# Patient Record
Sex: Male | Born: 1950 | Race: White | Hispanic: No | Marital: Single
Health system: Southern US, Community
[De-identification: ages and names within clinical notes are randomized; demographics above are authoritative.]

---

## 2020-01-26 ENCOUNTER — Emergency Department: Payer: Medicare Other

## 2020-01-26 ENCOUNTER — Emergency Department
Admission: EM | Admit: 2020-01-26 | Discharge: 2020-01-26 | Disposition: A | Discharge status: Home / Self Care | Payer: Medicare Other | Attending: Emergency Medicine | Admitting: Emergency Medicine

## 2020-01-26 ENCOUNTER — Other Ambulatory Visit: Payer: Self-pay

## 2020-01-26 DIAGNOSIS — D869 Sarcoidosis, unspecified: Secondary | ICD-10-CM | POA: Insufficient documentation

## 2020-01-26 DIAGNOSIS — Z20822 Contact with and (suspected) exposure to covid-19: Secondary | ICD-10-CM | POA: Insufficient documentation

## 2020-01-26 DIAGNOSIS — R079 Chest pain, unspecified: Secondary | ICD-10-CM | POA: Diagnosis present

## 2020-01-26 LAB — BASIC METABOLIC PANEL
Anion gap: 5 (ref 5–15)
BUN: 17 mg/dL (ref 8–23)
CO2: 25 mmol/L (ref 22–32)
Calcium: 9 mg/dL (ref 8.9–10.3)
Chloride: 110 mmol/L (ref 98–111)
Creatinine, Ser: 0.99 mg/dL (ref 0.61–1.24)
GFR calc Af Amer: >60 mL/min (ref >60)
GFR calc non Af Amer: >60 mL/min (ref >60)
Glucose, Bld: 88 mg/dL (ref 70–99)
Potassium: 3.9 mmol/L (ref 3.5–5.1)
Sodium: 140 mmol/L (ref 135–145)

## 2020-01-26 LAB — CBC
HCT: 42.3 % (ref 39.0–52.0)
Hemoglobin: 15.1 g/dL (ref 13.0–17.0)
MCH: 31.9 pg (ref 26.0–34.0)
MCHC: 35.7 g/dL (ref 30.0–36.0)
MCV: 89.2 fL (ref 80.0–100.0)
Platelets: 153 10*3/uL (ref 150–400)
RBC: 4.74 MIL/uL (ref 4.22–5.81)
RDW: 12.4 % (ref 11.5–15.5)
WBC: 9.4 10*3/uL (ref 4.0–10.5)
nRBC: 0 % (ref 0.0–0.2)

## 2020-01-26 LAB — SARS CORONAVIRUS 2 BY RT PCR (HOSPITAL ORDER, PERFORMED IN ~~LOC~~ HOSPITAL LAB): SARS Coronavirus 2: NEGATIVE

## 2020-01-26 LAB — TROPONIN I (HIGH SENSITIVITY): Troponin I (High Sensitivity): 13 ng/L (ref <18)

## 2020-01-26 NOTE — Discharge Instructions (Addendum)
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  It is very important that you follow-up with a pulmonary (lung) specialist to arrange for further tests and possible treatment as an outpatient.  We have provided referral information.  In the meantime, return to the ER for new, worsening, or persistent severe chest pain, difficulty breathing, weakness or lightheadedness, high fevers, or any other new or worsening symptoms that concern you.

## 2020-01-26 NOTE — ED Triage Notes (Signed)
Pt comes POV with chest pain. Pt reports pain that burns from center of his chest down left arm. Pt reports that this has been going on for a couple of months and he has these episodes where the pain makes him weak and confused then goes away. Having chest pain right now. Reports it feels like when he had covid pneumonia.

## 2020-01-26 NOTE — ED Provider Notes (Signed)
[facility] Emergency Department Provider Note ____________________________________________   First MD Initiated Contact with Patient 01/26/20 1828     (approximate)  I have reviewed the triage vital signs and the nursing notes.   HISTORY  Chief Complaint Chest Pain    HPI Alexis Bruce is a 69 y.o. male with no active medical problems who presents with chest pain over the last few months, possibly slightly worsened in the last few weeks, described as burning, present across both sides of his chest, and worst in the morning.  The patient states he is at a drug rehabilitation facility and has mentioned to people there, who told him he may be having anxiety.  He denies any specific anxiety.  He states he had Covid at the beginning of the year and has not been vaccinated.  He states the pain feels somewhat similar to when he had Covid pneumonia, although he denies associated cough, fever, shortness of breath, or systemic symptoms.  History reviewed. No pertinent past medical history.  There are no problems to display for this patient.     Prior to Admission medications   Not on File    Allergies Patient has no known allergies.  History reviewed. No pertinent family history.  Social History Social History   Tobacco Use  . Smoking status: Not on file  Substance Use Topics  . Alcohol use: Not on file  . Drug use: Not on file    Review of Systems  Constitutional: No fever/chills. Eyes: No redness. ENT: No sore throat. Cardiovascular: Positive for chest pain. Respiratory: Denies shortness of breath. Gastrointestinal: No vomiting or diarrhea.  Genitourinary: Negative for dysuria.  Musculoskeletal: Negative for back pain. Skin: Negative for rash. Neurological: Negative for headache.   ____________________________________________   PHYSICAL EXAM:  VITAL SIGNS: ED Triage Vitals [01/26/20 1530]  Enc Vitals Group     BP (!) 127/24      Pulse Rate (!) 51     Resp 18     Temp 97.6 F (36.4 C)     Temp Source Oral     SpO2 94 %     Weight 165 lb (74.8 kg)     Height 5\' 8"  (1.727 m)     Head Circumference      Peak Flow      Pain Score 4     Pain Loc      Pain Edu?      Excl. in GC?     Constitutional: Alert and oriented. Well appearing and in no acute distress. Eyes: Conjunctivae are normal.  Head: Atraumatic. Nose: No congestion/rhinnorhea. Mouth/Throat: Mucous membranes are moist.   Neck: Normal range of motion.  Cardiovascular: Normal rate, regular rhythm. Grossly normal heart sounds.  Good peripheral circulation. Respiratory: Normal respiratory effort.  No retractions. Lungs CTAB. Gastrointestinal: No distention.  Musculoskeletal: Extremities warm and well perfused.  Neurologic:  Normal speech and language. No gross focal neurologic deficits are appreciated.  Skin:  Skin is warm and dry. No rash noted. Psychiatric: Mood and affect are normal. Speech and behavior are normal.  ____________________________________________   LABS (all labs ordered are listed, but only abnormal results are displayed)  Labs Reviewed  SARS CORONAVIRUS 2 BY RT PCR (HOSPITAL ORDER, PERFORMED IN [facility])  BASIC METABOLIC PANEL  CBC  TROPONIN I (HIGH SENSITIVITY)   ____________________________________________  EKG  ED ECG REPORT I, , the attending physician, personally viewed and interpreted this ECG.  Date: 01/26/2020 EKG  Time: 1523 Rate: 55 Rhythm: normal sinus rhythm QRS Axis: normal Intervals: normal ST/T Wave abnormalities: normal Narrative Interpretation: no evidence of acute ischemia  ____________________________________________  RADIOLOGY  CXR: Multifocal opacities CT chest: Bilateral reticulonodular opacities and hilar adenopathy suggestive of sarcoidosis ____________________________________________   PROCEDURES  Procedure(s) performed:  No  Procedures  Critical Care performed: No ____________________________________________   INITIAL IMPRESSION / ASSESSMENT AND PLAN / ED COURSE  Pertinent labs & imaging results that were available during my care of the patient were reviewed by me and considered in my medical decision making (see chart for details).  69 year old male with no active medical problems presents with atypical and somewhat chronic chest pain which has been slightly worse recently.  He states he gets slightly winded when he exerts himself, but denies active shortness of breath, fever, or other associated symptoms.  He had Covid earlier this year but was not vaccinated subsequently.  On exam he is overall well-appearing.  His vital signs are normal.  The physical exam is unremarkable.  His lungs are clear to auscultation.  EKG is nonischemic.  Basic labs obtained from triage are also within normal limits, and his troponin is negative.  There is no increased indication for a repeat given the duration of his symptoms.  Chest x-ray, however, shows multifocal opacities.   Clinically the patient does not have symptoms of active pneumonia.  Differential includes scarring related to his COVID-19 pneumonia, recurrent COVID-19 especially given the delta variant present now, bronchitis, GERD or other nonpulmonary etiology.  There is no evidence of PE given his normal vital signs and the duration of the symptoms.  There is also no evidence of vascular etiology.  We will obtain a CT chest for further evaluation as well as Covid swab.  ----------------------------------------- 8:34 PM on 01/26/2020 -----------------------------------------  CT chest shows findings concerning for sarcoidosis.  At this time, there is no indication for inpatient admission.  The patient's vital signs are normal and his lab work-up is unremarkable.  The Covid swab is negative.  The patient is comfortable with no active symptoms at this time.  He is  stable for discharge home.  I counseled him on the results of the work-up and the recommended plan of care.  I will make a referral to pulmonology for further outpatient evaluation.  Return precautions given, the patient expresses understanding. ____________________________________________   FINAL CLINICAL IMPRESSION(S) / ED DIAGNOSES  Final diagnoses:  Sarcoidosis      NEW MEDICATIONS STARTED DURING THIS VISIT:  New Prescriptions   No medications on file     Note:  This document was prepared using Dragon voice recognition software and may include unintentional dictation errors.   Dionne Bucy, MD 01/26/20 2034

## 2020-02-23 ENCOUNTER — Inpatient Hospital Stay: Admission: RE | Admit: 2020-02-23 | Payer: Medicare Other | Source: Ambulatory Visit

## 2020-02-24 ENCOUNTER — Encounter
Admission: RE | Admit: 2020-02-24 | Discharge: 2020-02-24 | Disposition: A | Discharge status: Home / Self Care | Payer: Medicare Other | Source: Ambulatory Visit | Attending: Pulmonary Disease | Admitting: Pulmonary Disease

## 2020-02-24 NOTE — Pre-Procedure Instructions (Signed)
After several attempts to reach Alexis Bruce, the patient manager at [facility] where Alexis Bruce was residing, we made contact today.  I was informed by Alexis Bruce that this patient left yesterday without anyone knowing that he was leaving or where he was going.  He left clothes and money at facility and does not have a phone.  Alexis Bruce was unsure where the patient may have gone but thinks he may have gone to [place] to his sisters' home.  The feeling was that the patient was on the road to medical wellness but was not able to make it to the end of his inpatient rehab program.Alexis Bruce was due to graduate in 4 weeks.         I contacted Dr. Rhea Belton' office and spoke with Morrie Sheldon to give her this information.  I recommended that he not be cancelled yet until we see if he appears for his covid test and blood work. As of now, we have no way to contact the patient and have no idea of where he may be staying.

## 2020-02-25 ENCOUNTER — Encounter: Payer: Self-pay | Admitting: Urgent Care

## 2020-02-25 ENCOUNTER — Other Ambulatory Visit
Admission: RE | Admit: 2020-02-25 | Discharge: 2020-02-25 | Disposition: A | Discharge status: Home / Self Care | Payer: Medicare Other | Source: Ambulatory Visit | Attending: Pulmonary Disease | Admitting: Pulmonary Disease

## 2020-02-25 ENCOUNTER — Other Ambulatory Visit: Payer: Self-pay | Admitting: Pulmonary Disease

## 2020-02-25 ENCOUNTER — Ambulatory Visit: Payer: Medicare Other

## 2020-02-25 DIAGNOSIS — J439 Emphysema, unspecified: Secondary | ICD-10-CM

## 2020-02-25 DIAGNOSIS — R591 Generalized enlarged lymph nodes: Secondary | ICD-10-CM

## 2020-02-25 NOTE — Progress Notes (Addendum)
  [facility] Perioperative Services: Pre-Admission/Anesthesia Testing     Date: 02/25/20  Name: Alexis Bruce MRN:   [id number]  Re: Missed appointments and plans for upcoming procedure.   Patient unable to be contacted for pre-admission interview.  Patient was scheduled today for lab work and SARS-CoV-2 testing, however he did not present clinic for his appointment.  Of note, we learned that as of 1600 yesterday patient no longer resides at [facility].  Care manager, Haywood Pao, advised that patient fled from the residence yesterday and no one currently knows his whereabouts.  Patient does not have a phone.  Patient is his own RP, and does not have an emergency contact on file beyond the staff at the group home.  In light of the fact that we are unable to establish contact with patient and perform necessary presurgical testing, will proceed with canceling patient's procedure scheduled for 02/27/2020.  Call placed to [facility] practice Karna Christmas, MD) to make him aware.  PCCM practice asked to have patient contact PAT clinic if they by some chance are able to make contact with him.  SDS charge nurse contacted and asked to remove patient from OR schedule at this time.  Quentin Mulling, MSN, APRN, FNP-C, [facility]  Peri-operative Services Nurse Practitioner Phone: [phone] 02/25/20 2:07 PM

## 2020-02-27 ENCOUNTER — Encounter: Admission: RE | Payer: Self-pay | Source: Home / Self Care

## 2020-02-27 ENCOUNTER — Ambulatory Visit: Admission: RE | Admit: 2020-02-27 | Payer: Medicare Other | Source: Home / Self Care

## 2020-02-27 SURGERY — VIDEO BRONCHOSCOPY WITH ENDOBRONCHIAL NAVIGATION
Anesthesia: General

## 2021-10-09 IMAGING — CT CT CHEST W/O CM
2 of 3 series · 15 of 36 positions shown, 18 images · non-contrast
Comparison: Radiograph 01/26/2020

CLINICAL DATA: Chest pain for several months, radiographic
abnormality

EXAM:
CT CHEST WITHOUT CONTRAST
TECHNIQUE: Multidetector CT imaging of the chest was performed following the
standard protocol without IV contrast.

[Series 2: thorax · axial · 0.70mm/px · z∈[-548,-276]mm · 12 of 160 slices shown, 15 images]
[im 12/160  mediastinal]
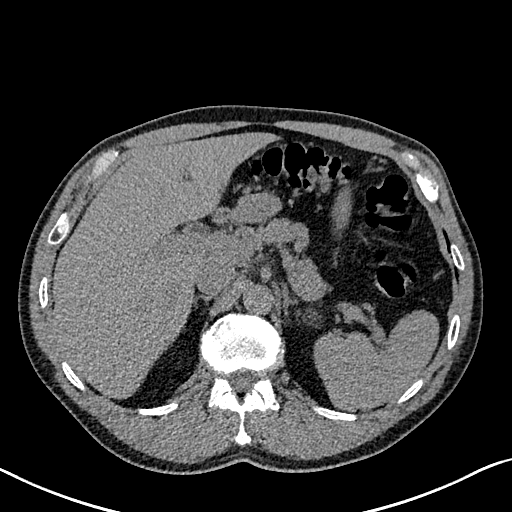
[im 12/160  lung]
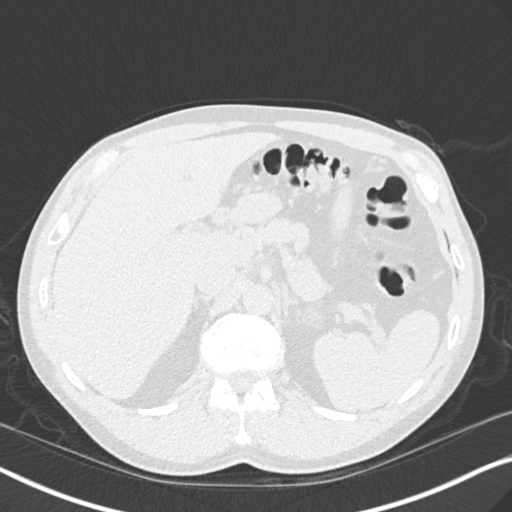
[im 24/160  lung]
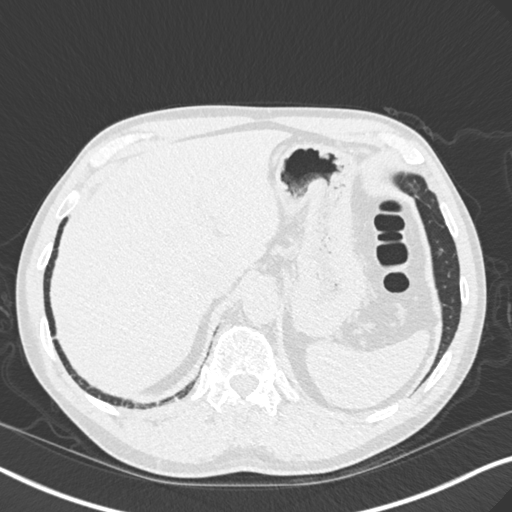
[im 36/160  lung]
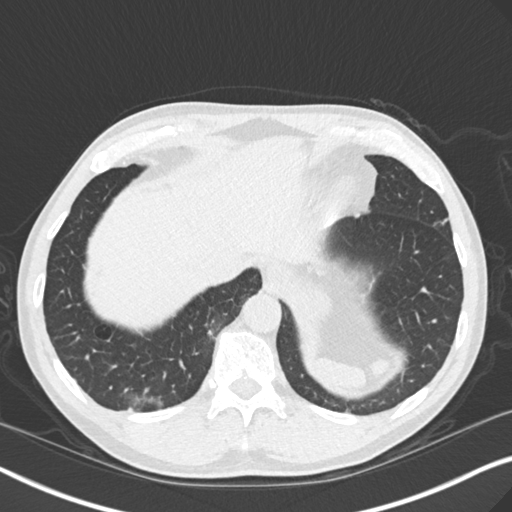
[im 48/160  lung]
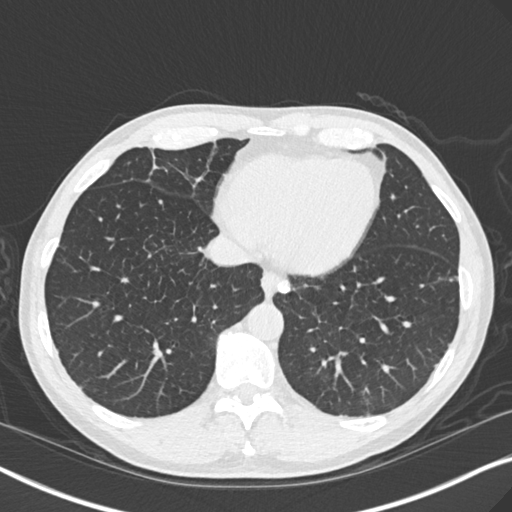
[im 59/160  mediastinal]
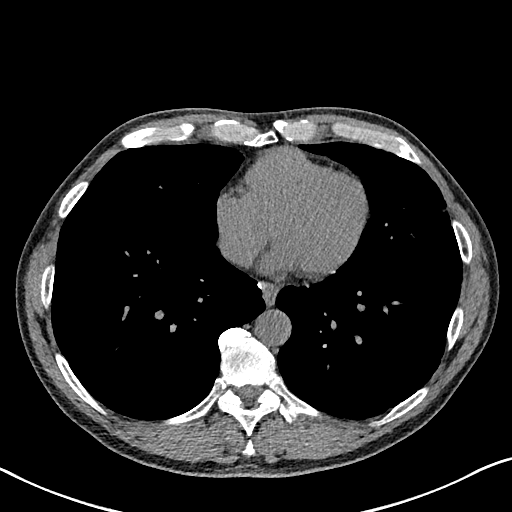
[im 59/160  lung]
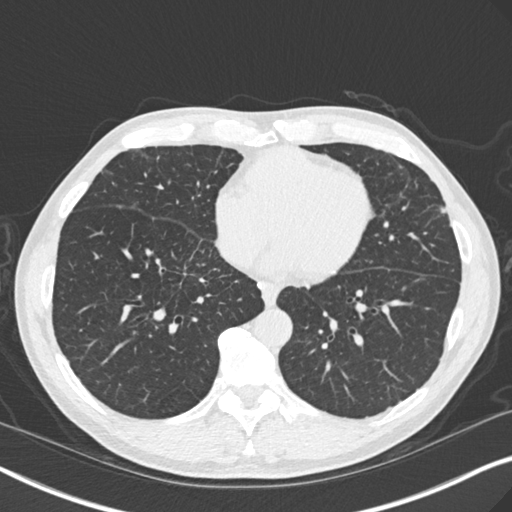
[im 71/160  lung]
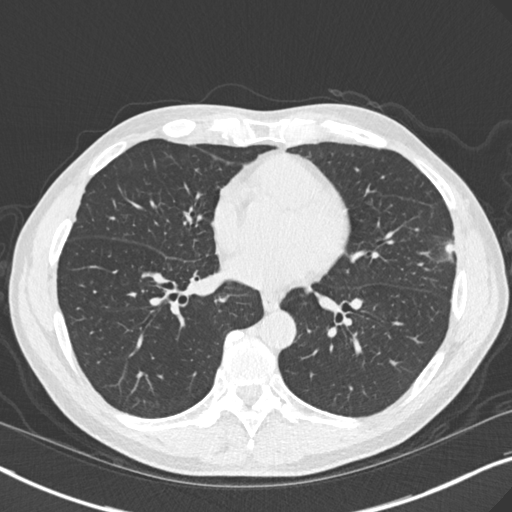
[im 89/160  lung]
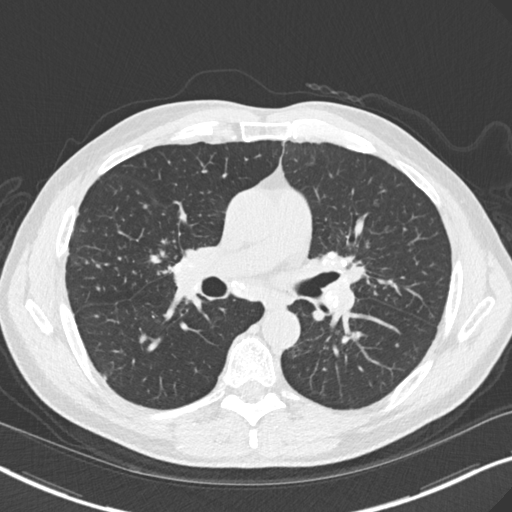
[im 101/160  lung]
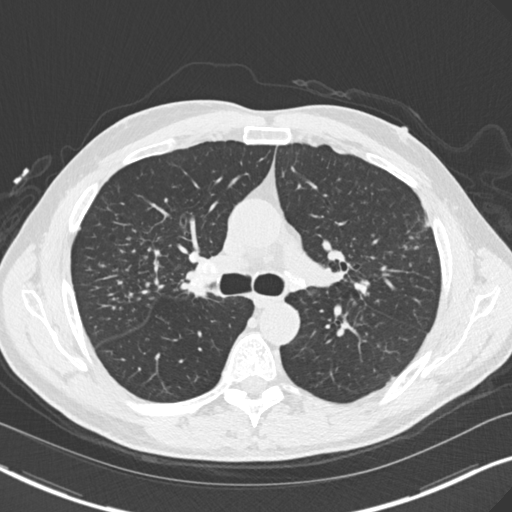
[im 112/160  mediastinal]
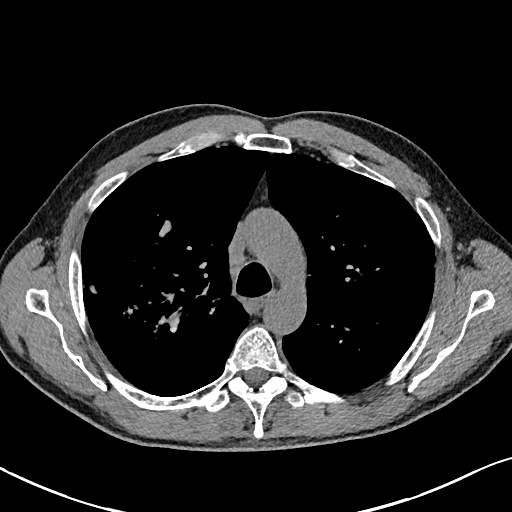
[im 112/160  lung]
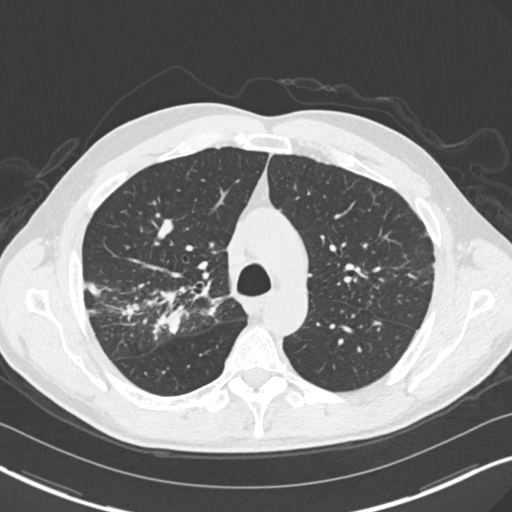
[im 124/160  lung]
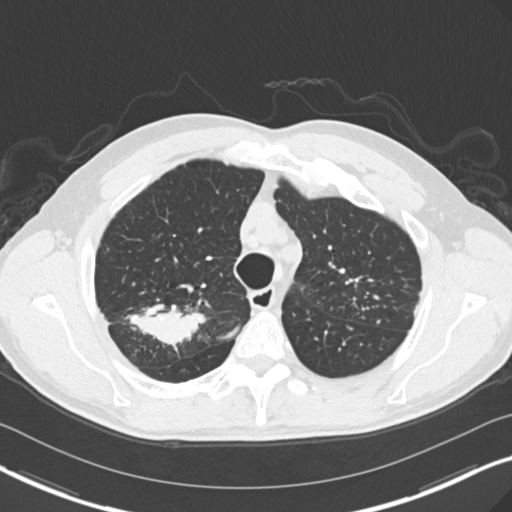
[im 136/160  lung]
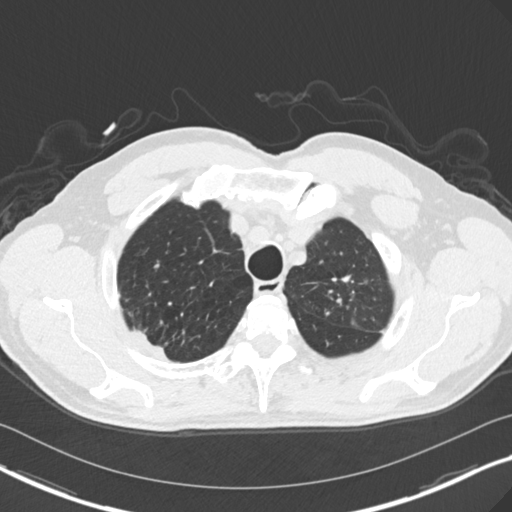
[im 148/160  lung]
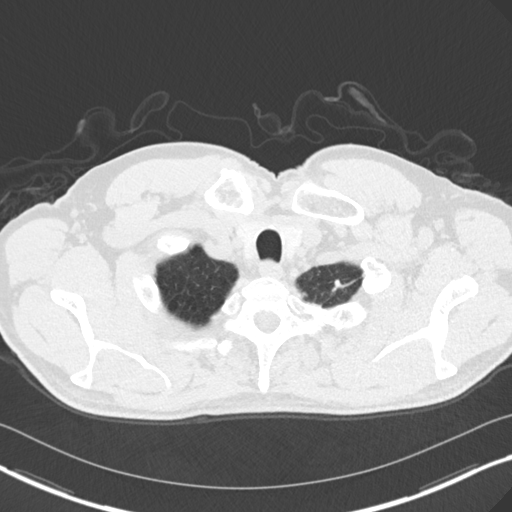

[Series 5: coronal · coronal · 0.64mm/px · 3 of 131 slices shown]
[im 27/131  lung]
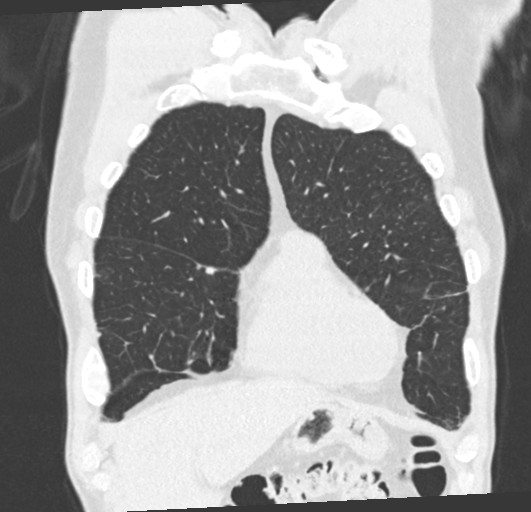
[im 53/131  lung]
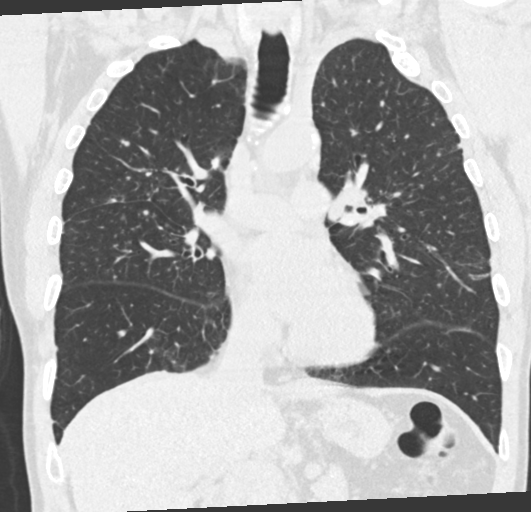
[im 79/131  lung]
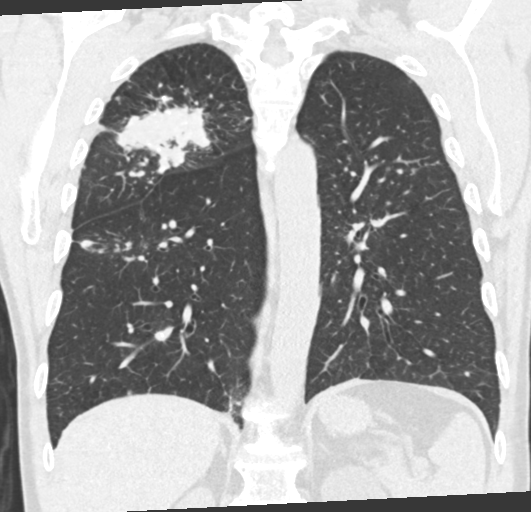

[15 of 36 positions shown; findings below may reference images not displayed]

FINDINGS: Cardiovascular: Normal heart size. No pericardial effusion.
Atherosclerotic plaque within the normal caliber aorta. Shared
origin of the brachiocephalic and left common carotid artery.
Central pulmonary arteries are normal caliber. Luminal evaluation of
the vasculature limited in the absence of contrast media.

Mediastinum/Nodes: Extensive mediastinal hilar adenopathy with
stippled calcifications. Larger nodes include a 13 mm subcarinal
node (2/69) a 12 mm calcified left hilar node (2/69) and a 14 mm
right hilar node (2/80). No axillary adenopathy. No mediastinal
fluid or gas. Normal thyroid gland and thoracic inlet. No acute
abnormality of the trachea or esophagus.

Lungs/Pleura: Widespread areas of reticulonodular opacity with a
upper lung predominance with several larger more confluent areas of
opacity in the right upper lobe demonstrating stable calcification
as well. There several additional nodules with surrounding reticular
changes including a 12 mm right upper lobe nodule (3/48). Associated
areas of architectural distortion and bronchiectasis. No
pneumothorax or effusion.

Upper Abdomen: No acute abnormalities present in the visualized
portions of the upper abdomen.

Musculoskeletal: No chest wall mass or suspicious bone lesions
identified. Degenerative changes in the spine and shoulders.
IMPRESSION: 1. Widespread areas of reticulonodular opacity and architectural
distortion with a upper lung predominance as well as more confluent
areas of calcified opacity with surrounding reticular change. There
is extensive calcified mediastinal and hilar adenopathy as well.
Constellation of findings is fairly suggestive of pulmonary sarcoid
though pulmonary consultation for more definitive diagnosis and
management is recommended.
2. Aortic Atherosclerosis ([id number]).

## 2021-10-09 IMAGING — CR DG CHEST 2V
1 series · 2 of 2 positions shown · non-contrast
Comparison: None.

CLINICAL DATA: 69-year-old male with chest pain.

EXAM:
CHEST - 2 VIEW

[Series 1: dg chest 2 view · 0.14mm/px · 2 of 2 slices shown]
[im 1/2]
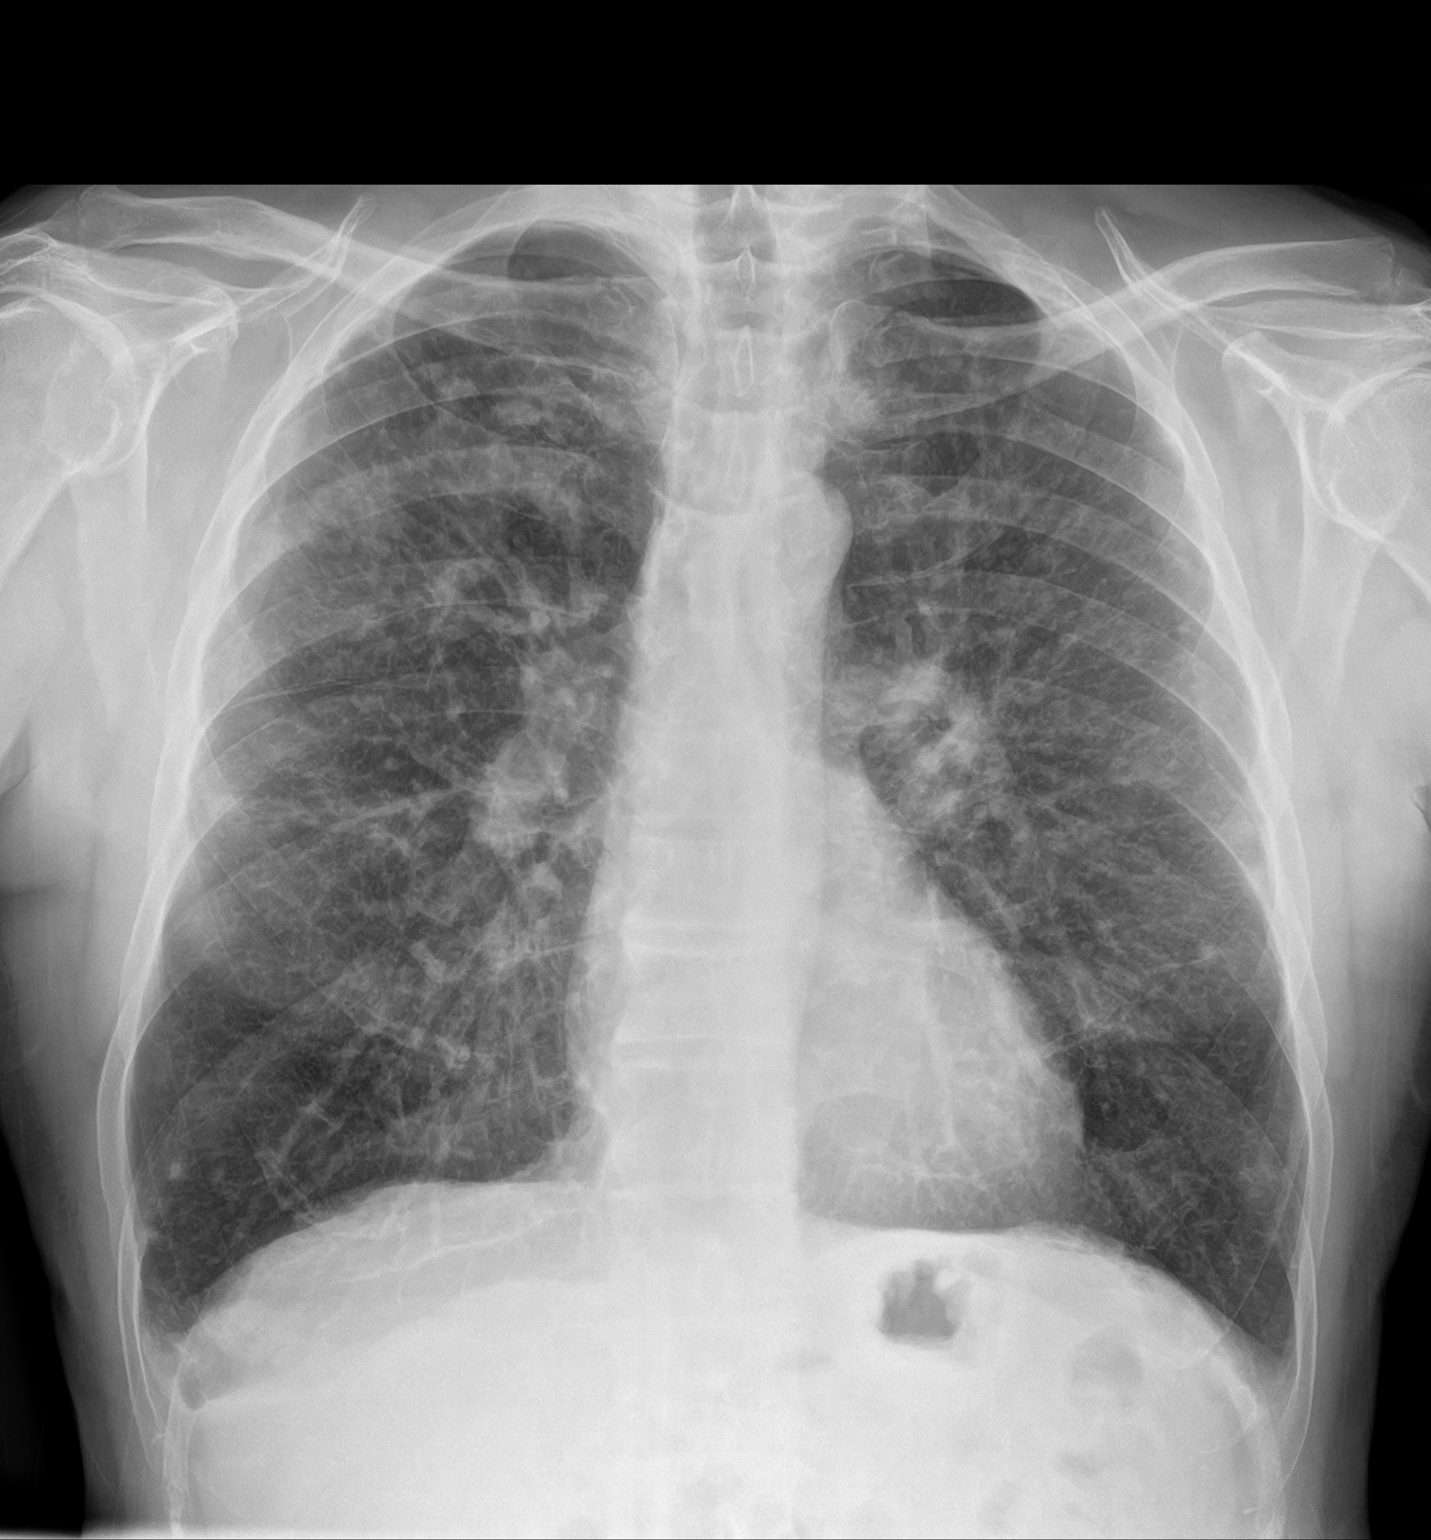
[im 2/2]
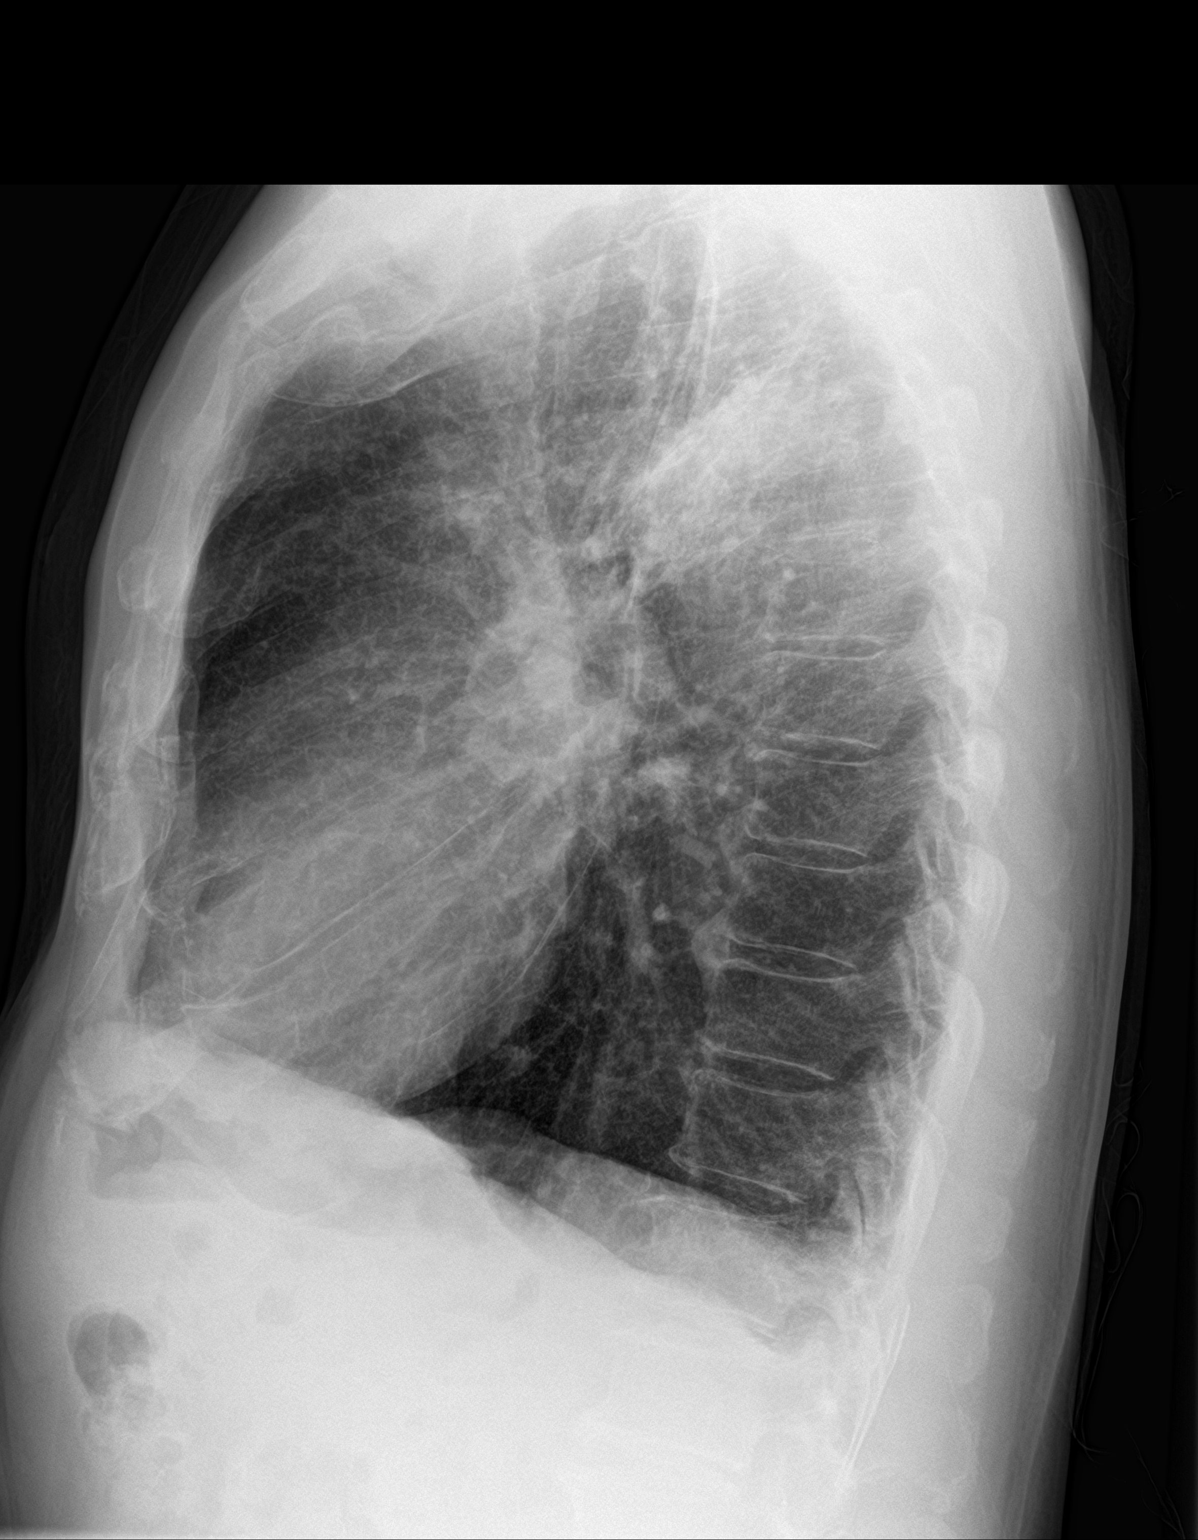

[2 of 2 positions shown; findings below may reference images not displayed]

FINDINGS: There is diffuse interstitial prominence and nodular densities.
There is a patchy area of airspace opacity in the right upper lobe.
Findings may represent multifocal pneumonia, possibly of atypical
etiology. Metastatic disease is not excluded. Clinical correlation
recommended. CT may provide better evaluation. There is no pleural
effusion or pneumothorax. The cardiac silhouette is within limits.
No acute osseous pathology.
IMPRESSION: Findings may represent multifocal pneumonia, possibly of atypical
etiology. Clinical correlation and follow-up to resolution
recommended. CT may provide better evaluation.
